# Patient Record
Sex: Female | Born: 1965 | Race: White | Hispanic: No | Marital: Married | Smoking: Never smoker
Health system: Southern US, Community
[De-identification: ages and names within clinical notes are randomized; demographics above are authoritative.]

## PROBLEM LIST (undated history)

## (undated) DIAGNOSIS — R000 Tachycardia, unspecified: Secondary | ICD-10-CM

## (undated) DIAGNOSIS — I10 Essential (primary) hypertension: Secondary | ICD-10-CM

## (undated) DIAGNOSIS — M797 Fibromyalgia: Secondary | ICD-10-CM

## (undated) DIAGNOSIS — Z780 Asymptomatic menopausal state: Secondary | ICD-10-CM

## (undated) DIAGNOSIS — T783XXA Angioneurotic edema, initial encounter: Secondary | ICD-10-CM

## (undated) DIAGNOSIS — N6002 Solitary cyst of left breast: Secondary | ICD-10-CM

## (undated) DIAGNOSIS — N390 Urinary tract infection, site not specified: Secondary | ICD-10-CM

## (undated) HISTORY — DX: Fibromyalgia: M79.7

## (undated) HISTORY — DX: Urinary tract infection, site not specified: N39.0

## (undated) HISTORY — DX: Essential (primary) hypertension: I10

## (undated) HISTORY — DX: Tachycardia, unspecified: R00.0

## (undated) HISTORY — DX: Solitary cyst of left breast: N60.02

## (undated) HISTORY — DX: Asymptomatic menopausal state: Z78.0

## (undated) HISTORY — DX: Angioneurotic edema, initial encounter: T78.3XXA

---

## 1998-01-21 ENCOUNTER — Ambulatory Visit (HOSPITAL_COMMUNITY): Admission: RE | Admit: 1998-01-21 | Discharge: 1998-01-21 | Payer: Self-pay | Admitting: Obstetrics and Gynecology

## 1998-07-30 ENCOUNTER — Other Ambulatory Visit: Admission: RE | Admit: 1998-07-30 | Discharge: 1998-07-30 | Payer: Self-pay | Admitting: Obstetrics and Gynecology

## 1999-10-04 ENCOUNTER — Inpatient Hospital Stay (HOSPITAL_COMMUNITY): Admission: AD | Admit: 1999-10-04 | Discharge: 1999-10-04 | Payer: Self-pay | Admitting: Obstetrics and Gynecology

## 1999-10-09 ENCOUNTER — Inpatient Hospital Stay (HOSPITAL_COMMUNITY): Admission: AD | Admit: 1999-10-09 | Discharge: 1999-10-11 | Payer: Self-pay | Admitting: Obstetrics and Gynecology

## 1999-11-29 ENCOUNTER — Other Ambulatory Visit: Admission: RE | Admit: 1999-11-29 | Discharge: 1999-11-29 | Payer: Self-pay | Admitting: Obstetrics and Gynecology

## 2000-11-01 ENCOUNTER — Encounter: Payer: Self-pay | Admitting: Obstetrics and Gynecology

## 2000-11-01 ENCOUNTER — Encounter: Admission: RE | Admit: 2000-11-01 | Discharge: 2000-11-01 | Payer: Self-pay | Admitting: Obstetrics and Gynecology

## 2000-12-06 ENCOUNTER — Other Ambulatory Visit: Admission: RE | Admit: 2000-12-06 | Discharge: 2000-12-06 | Payer: Self-pay | Admitting: Obstetrics and Gynecology

## 2001-11-27 ENCOUNTER — Other Ambulatory Visit: Admission: RE | Admit: 2001-11-27 | Discharge: 2001-11-27 | Payer: Self-pay | Admitting: Obstetrics and Gynecology

## 2003-01-06 ENCOUNTER — Other Ambulatory Visit: Admission: RE | Admit: 2003-01-06 | Discharge: 2003-01-06 | Payer: Self-pay | Admitting: Obstetrics and Gynecology

## 2004-01-12 ENCOUNTER — Other Ambulatory Visit: Admission: RE | Admit: 2004-01-12 | Discharge: 2004-01-12 | Payer: Self-pay | Admitting: Obstetrics and Gynecology

## 2005-02-06 ENCOUNTER — Other Ambulatory Visit: Admission: RE | Admit: 2005-02-06 | Discharge: 2005-02-06 | Payer: Self-pay | Admitting: Obstetrics and Gynecology

## 2010-03-04 ENCOUNTER — Encounter
Admission: RE | Admit: 2010-03-04 | Discharge: 2010-03-04 | Payer: Self-pay | Source: Home / Self Care | Attending: Obstetrics and Gynecology | Admitting: Obstetrics and Gynecology

## 2012-10-03 ENCOUNTER — Other Ambulatory Visit: Payer: Self-pay | Admitting: Obstetrics and Gynecology

## 2012-10-03 DIAGNOSIS — N6320 Unspecified lump in the left breast, unspecified quadrant: Secondary | ICD-10-CM

## 2012-10-04 ENCOUNTER — Other Ambulatory Visit: Payer: Self-pay | Admitting: *Deleted

## 2012-10-04 DIAGNOSIS — I8390 Asymptomatic varicose veins of unspecified lower extremity: Secondary | ICD-10-CM

## 2012-10-15 ENCOUNTER — Other Ambulatory Visit: Payer: Self-pay

## 2012-10-28 ENCOUNTER — Ambulatory Visit
Admission: RE | Admit: 2012-10-28 | Discharge: 2012-10-28 | Disposition: A | Discharge status: Home / Self Care | Payer: 59 | Source: Ambulatory Visit | Attending: Obstetrics and Gynecology | Admitting: Obstetrics and Gynecology

## 2012-10-28 DIAGNOSIS — N6320 Unspecified lump in the left breast, unspecified quadrant: Secondary | ICD-10-CM

## 2012-12-17 ENCOUNTER — Encounter: Payer: Self-pay | Admitting: Vascular Surgery

## 2012-12-19 ENCOUNTER — Encounter (HOSPITAL_COMMUNITY): Payer: Self-pay

## 2012-12-19 ENCOUNTER — Encounter: Payer: Self-pay | Admitting: Vascular Surgery

## 2013-01-14 ENCOUNTER — Encounter: Payer: Self-pay | Admitting: Vascular Surgery

## 2013-01-15 ENCOUNTER — Other Ambulatory Visit: Payer: Self-pay | Admitting: Vascular Surgery

## 2013-01-15 ENCOUNTER — Ambulatory Visit (INDEPENDENT_AMBULATORY_CARE_PROVIDER_SITE_OTHER): Payer: 59 | Admitting: Vascular Surgery

## 2013-01-15 ENCOUNTER — Encounter: Payer: Self-pay | Admitting: Vascular Surgery

## 2013-01-15 ENCOUNTER — Ambulatory Visit (HOSPITAL_COMMUNITY)
Admission: RE | Admit: 2013-01-15 | Discharge: 2013-01-15 | Disposition: A | Discharge status: Home / Self Care | Payer: 59 | Source: Ambulatory Visit | Attending: Vascular Surgery | Admitting: Vascular Surgery

## 2013-01-15 VITALS — BP 121/62 | HR 77 | Ht 69.0 in | Wt 205.8 lb

## 2013-01-15 DIAGNOSIS — I83893 Varicose veins of bilateral lower extremities with other complications: Secondary | ICD-10-CM | POA: Insufficient documentation

## 2013-01-15 DIAGNOSIS — I8390 Asymptomatic varicose veins of unspecified lower extremity: Secondary | ICD-10-CM | POA: Insufficient documentation

## 2013-01-15 NOTE — Progress Notes (Signed)
[facility] [place]  Patient name: Donna Schmidt MRN: [id number] DOB: August 29, 1965 Sex: female  REASON FOR CONSULT: varicose veins.  HPI: Donna Schmidt is a 47 y.o. 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 She's had spider veins especially on the right leg from any years. She experiences some tingling and heaviness in her right leg that is increased with standing. Her symptoms are not affected by sitting. Elevation helps her symptoms somewhat. She is unaware of any previous history of DVT or phlebitis. She does state that her father had varicose veins. She's had no bleeding episodes from her varicose veins.  PMH: he denies any history of diabetes, hypertension, hypercholesterolemia, history of previous myocardial infarction, or history of congestive heart failure.  Family History  Problem Relation Age of Onset  . Varicose Veins Father   . Hypertension Father    SOCIAL HISTORY: History  Substance Use Topics  . Smoking status: Never Smoker   . Smokeless tobacco: Never Used  . Alcohol Use: Yes     Comment: occ   No Known Allergies Current Outpatient Prescriptions  Medication Sig Dispense Refill  . amoxicillin (AMOXIL) 875 MG tablet Take 1 tablet by mouth 2 (two) times daily.      Marland Kitchen lisinopril-hydrochlorothiazide (PRINZIDE,ZESTORETIC) 20-25 MG per tablet Take 1 tablet by mouth daily.       No current facility-administered medications for this visit.   REVIEW OF SYSTEMS: Arly.Keller ] denotes positive finding; [  ] denotes negative finding  CARDIOVASCULAR:  [ ]  chest pain   [ ]  chest pressure   [ ]  palpitations   [ ]  orthopnea   [ ]  dyspnea on exertion   [ ]  claudication   [ ]  rest pain   [ ]  DVT   [ ]  phlebitis PULMONARY:   [ ]  productive cough   [ ]  asthma   [ ]  wheezing NEUROLOGIC:   [ ]  weakness  [ ]  paresthesias  [ ]  aphasia  [ ]  amaurosis  [ ]  dizziness HEMATOLOGIC:   [ ]  bleeding problems   [ ]  clotting  disorders MUSCULOSKELETAL:  [ ]  joint pain   [ ]  joint swelling Arly.Keller ] leg swelling GASTROINTESTINAL: [ ]   blood in stool  [ ]   hematemesis GENITOURINARY:  [ ]   dysuria  [ ]   hematuria PSYCHIATRIC:  [ ]  history of major depression INTEGUMENTARY:  [ ]  rashes  [ ]  ulcers CONSTITUTIONAL:  [ ]  fever   [ ]  chills  PHYSICAL EXAM: Filed Vitals:   01/15/13 1448  BP: 121/62  Pulse: 77  Height: 5\' 9"  (1.753 m)  Weight: 205 lb 12.8 oz (93.35 kg)  SpO2: 100%   Body mass index is 30.38 kg/(m^2). GENERAL: The patient is a well-nourished female, in no acute distress. The vital signs are documented above. CARDIOVASCULAR: There is a regular rate and rhythm. Do not detect carotid bruits. She has palpable pedal pulses. She has mild bilateral lower extremity swelling. PULMONARY: There is good air exchange bilaterally without wheezing or rales. ABDOMEN: Soft and non-tender with normal pitched bowel sounds.  MUSCULOSKELETAL: There are no major deformities or cyanosis. NEUROLOGIC: No focal weakness or paresthesias are detected. SKIN: She has spider veins and telangiectasias of her right anterior leg and right lateral leg. She has some small spider veins on the left leg also. PSYCHIATRIC: The patient has a normal affect.  DATA:  I have reviewed her records from Dr. Lorrene Reid office. He has normal  thyroid function tests. Her creatinine is normal. Cholesterol is 168. HDL cholesterol is 44.  I have independently interpreted the carotid duplex scan done in our office today which shows no evidence of DVT in either lower extremity. There is no evidence of superficial thrombophlebitis bilaterally. She does have some venous incompetence of the deep veins bilaterally and also segments of her greater saphenous vein bilaterally.  MEDICAL ISSUES:  Varicose veins of lower extremities with other complications This patient has spider veins on the right which are causing minimal symptoms. I think she would be a slightly high  risk for recurrence with sclerotherapy given the reflux she has in the deep system and also segments of her saphenous vein on the right. However at this point I do not think the reflux in the saphenous vein on the right wards consideration for laser ablation. Essentially has no symptoms on the left leg with some very small varicose veins. We have discussed the importance of intermittent leg elevation and the proper positioning for this. In addition I have written her a prescription for knee-high compression stockings with a gradient of 15-20 mm of mercury. Encouraged her to stay as active as possible and avoid prolonged sitting and standing. We will be happy to see her back at any time if her symptoms progress or her varicose veins progress.   Wendelyn Kiesling S [facility] [place] Beeper: [phone]

## 2013-01-15 NOTE — Assessment & Plan Note (Signed)
This patient has spider veins on the right which are causing minimal symptoms. I think she would be a slightly high risk for recurrence with sclerotherapy given the reflux she has in the deep system and also segments of her saphenous vein on the right. However at this point I do not think the reflux in the saphenous vein on the right wards consideration for laser ablation. Essentially has no symptoms on the left leg with some very small varicose veins. We have discussed the importance of intermittent leg elevation and the proper positioning for this. In addition I have written her a prescription for knee-high compression stockings with a gradient of 15-20 mm of mercury. Encouraged her to stay as active as possible and avoid prolonged sitting and standing. We will be happy to see her back at any time if her symptoms progress or her varicose veins progress.

## 2013-01-16 ENCOUNTER — Encounter: Payer: Self-pay | Admitting: Obstetrics and Gynecology

## 2013-11-10 ENCOUNTER — Other Ambulatory Visit: Payer: Self-pay | Admitting: Obstetrics and Gynecology

## 2013-11-10 DIAGNOSIS — N6002 Solitary cyst of left breast: Secondary | ICD-10-CM

## 2013-11-18 ENCOUNTER — Ambulatory Visit
Admission: RE | Admit: 2013-11-18 | Discharge: 2013-11-18 | Disposition: A | Discharge status: Home / Self Care | Payer: 59 | Source: Ambulatory Visit | Attending: Obstetrics and Gynecology | Admitting: Obstetrics and Gynecology

## 2013-11-18 ENCOUNTER — Encounter (INDEPENDENT_AMBULATORY_CARE_PROVIDER_SITE_OTHER): Payer: Self-pay

## 2013-11-18 DIAGNOSIS — N6002 Solitary cyst of left breast: Secondary | ICD-10-CM

## 2015-02-02 ENCOUNTER — Other Ambulatory Visit: Payer: Self-pay | Admitting: Obstetrics and Gynecology

## 2015-02-02 DIAGNOSIS — Z1231 Encounter for screening mammogram for malignant neoplasm of breast: Secondary | ICD-10-CM

## 2015-03-09 ENCOUNTER — Ambulatory Visit
Admission: RE | Admit: 2015-03-09 | Discharge: 2015-03-09 | Disposition: A | Discharge status: Home / Self Care | Payer: BC Managed Care – PPO | Source: Ambulatory Visit | Attending: Obstetrics and Gynecology | Admitting: Obstetrics and Gynecology

## 2015-03-09 DIAGNOSIS — Z1231 Encounter for screening mammogram for malignant neoplasm of breast: Secondary | ICD-10-CM

## 2016-03-21 ENCOUNTER — Other Ambulatory Visit: Payer: Self-pay | Admitting: Obstetrics and Gynecology

## 2016-03-21 DIAGNOSIS — Z1231 Encounter for screening mammogram for malignant neoplasm of breast: Secondary | ICD-10-CM

## 2016-04-17 ENCOUNTER — Ambulatory Visit
Admission: RE | Admit: 2016-04-17 | Discharge: 2016-04-17 | Disposition: A | Discharge status: Home / Self Care | Payer: BC Managed Care – PPO | Source: Ambulatory Visit | Attending: Obstetrics and Gynecology | Admitting: Obstetrics and Gynecology

## 2016-04-17 DIAGNOSIS — Z1231 Encounter for screening mammogram for malignant neoplasm of breast: Secondary | ICD-10-CM

## 2016-04-19 ENCOUNTER — Other Ambulatory Visit: Payer: Self-pay | Admitting: Obstetrics and Gynecology

## 2016-04-19 DIAGNOSIS — R928 Other abnormal and inconclusive findings on diagnostic imaging of breast: Secondary | ICD-10-CM

## 2016-04-26 ENCOUNTER — Ambulatory Visit
Admission: RE | Admit: 2016-04-26 | Discharge: 2016-04-26 | Disposition: A | Discharge status: Home / Self Care | Payer: BC Managed Care – PPO | Source: Ambulatory Visit | Attending: Obstetrics and Gynecology | Admitting: Obstetrics and Gynecology

## 2016-04-26 DIAGNOSIS — R928 Other abnormal and inconclusive findings on diagnostic imaging of breast: Secondary | ICD-10-CM

## 2017-02-23 ENCOUNTER — Other Ambulatory Visit: Payer: Self-pay | Admitting: Obstetrics and Gynecology

## 2017-02-23 DIAGNOSIS — N6489 Other specified disorders of breast: Secondary | ICD-10-CM

## 2017-02-27 ENCOUNTER — Other Ambulatory Visit: Payer: BC Managed Care – PPO

## 2017-02-28 ENCOUNTER — Ambulatory Visit
Admission: RE | Admit: 2017-02-28 | Discharge: 2017-02-28 | Disposition: A | Discharge status: Home / Self Care | Payer: BC Managed Care – PPO | Source: Ambulatory Visit | Attending: Obstetrics and Gynecology | Admitting: Obstetrics and Gynecology

## 2017-02-28 DIAGNOSIS — N6489 Other specified disorders of breast: Secondary | ICD-10-CM

## 2018-04-22 ENCOUNTER — Other Ambulatory Visit: Payer: Self-pay | Admitting: Obstetrics and Gynecology

## 2018-04-22 ENCOUNTER — Other Ambulatory Visit: Payer: Self-pay | Admitting: Neurosurgery

## 2018-04-22 DIAGNOSIS — Z1231 Encounter for screening mammogram for malignant neoplasm of breast: Secondary | ICD-10-CM

## 2018-05-20 ENCOUNTER — Ambulatory Visit
Admission: RE | Admit: 2018-05-20 | Discharge: 2018-05-20 | Disposition: A | Discharge status: Home / Self Care | Payer: BC Managed Care – PPO | Source: Ambulatory Visit | Attending: Obstetrics and Gynecology | Admitting: Obstetrics and Gynecology

## 2018-05-20 DIAGNOSIS — Z1231 Encounter for screening mammogram for malignant neoplasm of breast: Secondary | ICD-10-CM

## 2018-05-22 ENCOUNTER — Other Ambulatory Visit: Payer: Self-pay | Admitting: Obstetrics and Gynecology

## 2018-05-22 DIAGNOSIS — R928 Other abnormal and inconclusive findings on diagnostic imaging of breast: Secondary | ICD-10-CM

## 2018-05-28 ENCOUNTER — Ambulatory Visit
Admission: RE | Admit: 2018-05-28 | Discharge: 2018-05-28 | Disposition: A | Discharge status: Home / Self Care | Payer: BC Managed Care – PPO | Source: Ambulatory Visit | Attending: Obstetrics and Gynecology | Admitting: Obstetrics and Gynecology

## 2018-05-28 DIAGNOSIS — R928 Other abnormal and inconclusive findings on diagnostic imaging of breast: Secondary | ICD-10-CM

## 2019-06-16 ENCOUNTER — Other Ambulatory Visit: Payer: Self-pay | Admitting: Obstetrics and Gynecology

## 2019-06-16 DIAGNOSIS — Z1231 Encounter for screening mammogram for malignant neoplasm of breast: Secondary | ICD-10-CM

## 2019-07-01 ENCOUNTER — Ambulatory Visit
Admission: RE | Admit: 2019-07-01 | Discharge: 2019-07-01 | Disposition: A | Discharge status: Home / Self Care | Payer: BC Managed Care – PPO | Source: Ambulatory Visit | Attending: Obstetrics and Gynecology | Admitting: Obstetrics and Gynecology

## 2019-07-01 ENCOUNTER — Other Ambulatory Visit: Payer: Self-pay

## 2019-07-01 DIAGNOSIS — Z1231 Encounter for screening mammogram for malignant neoplasm of breast: Secondary | ICD-10-CM

## 2019-12-04 IMAGING — MG DIGITAL DIAGNOSTIC UNILATERAL RIGHT MAMMOGRAM WITH TOMO AND CAD
4 series · 4 of 12 positions shown · non-contrast
Comparison: Previous exam(s).

CLINICAL DATA: Recall from screening mammography with
tomosynthesis, mass or asymmetry involving the OUTER RIGHT breast at
MIDDLE depth visible only on the CC images.

EXAM:
DIGITAL DIAGNOSTIC RIGHT MAMMOGRAM WITH CAD AND TOMO
ULTRASOUND RIGHT BREAST

[R CC synth-2D]
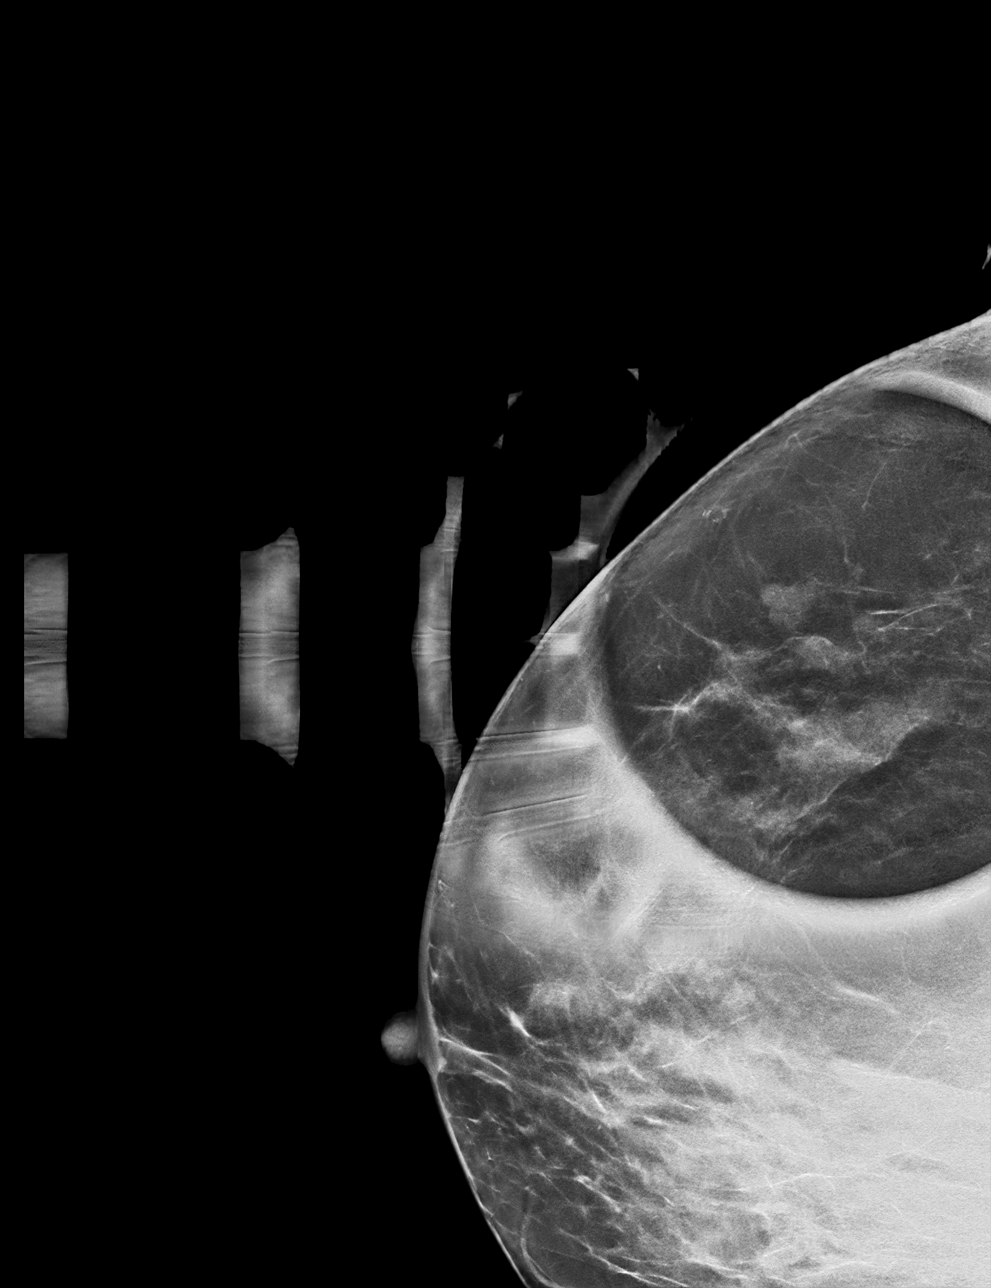

[R ML synth-2D]
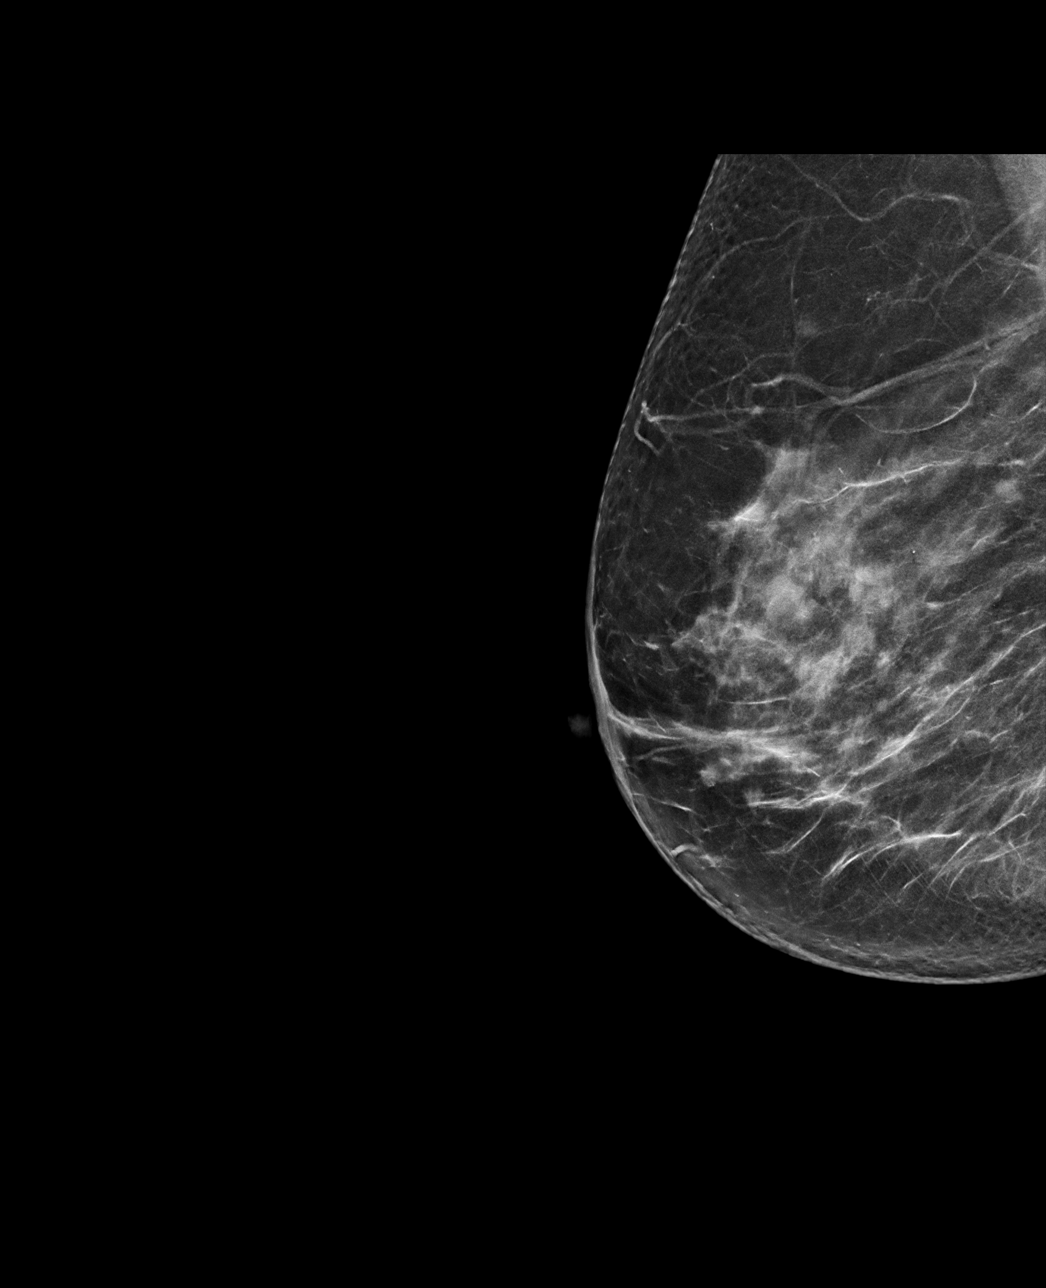

[R CC tomo · tomo slice 33/64.0]
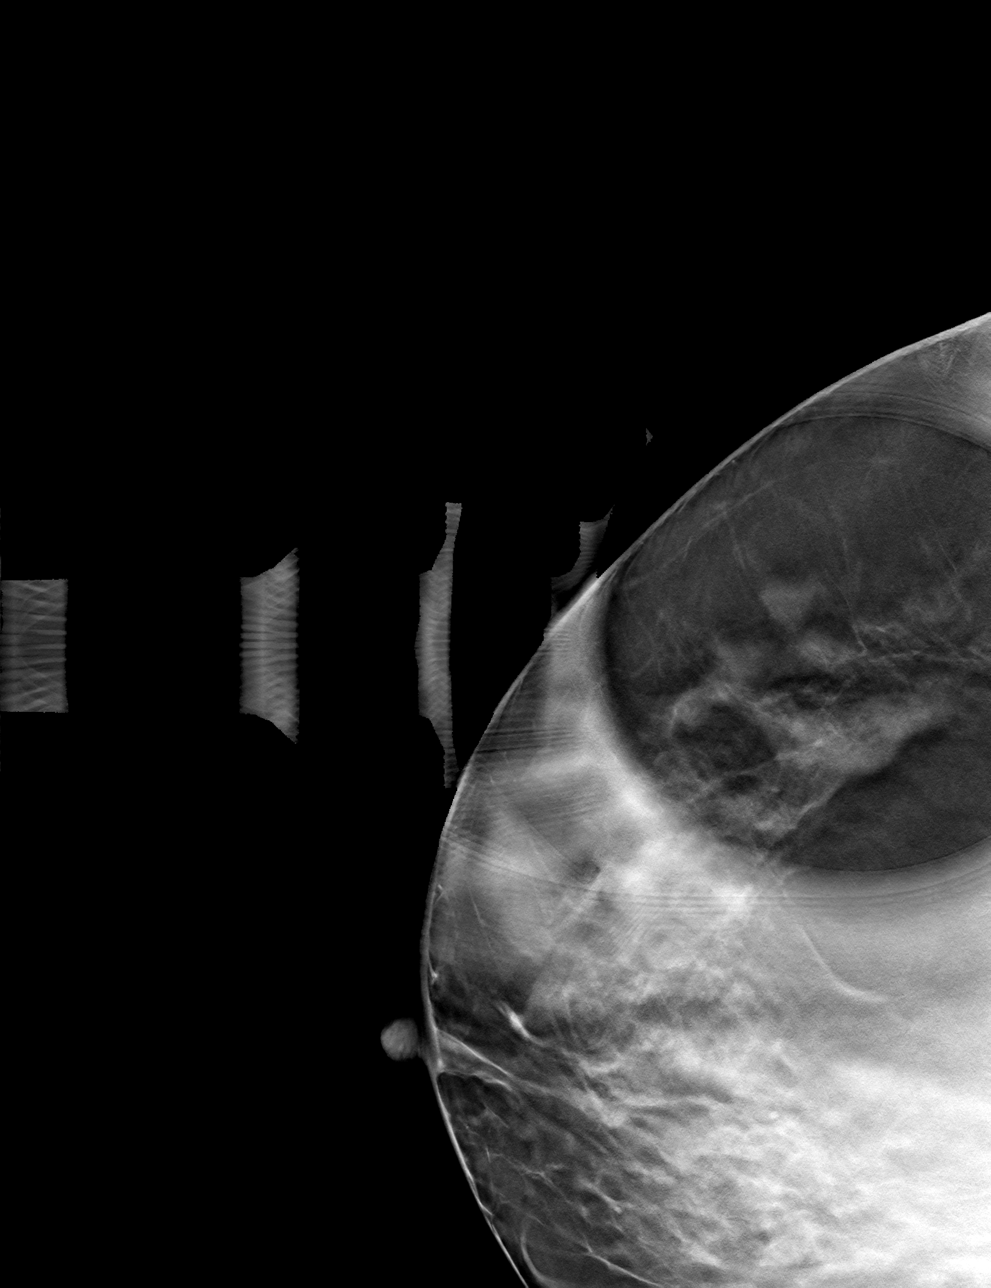

[R ML tomo · tomo slice 41/82.0]
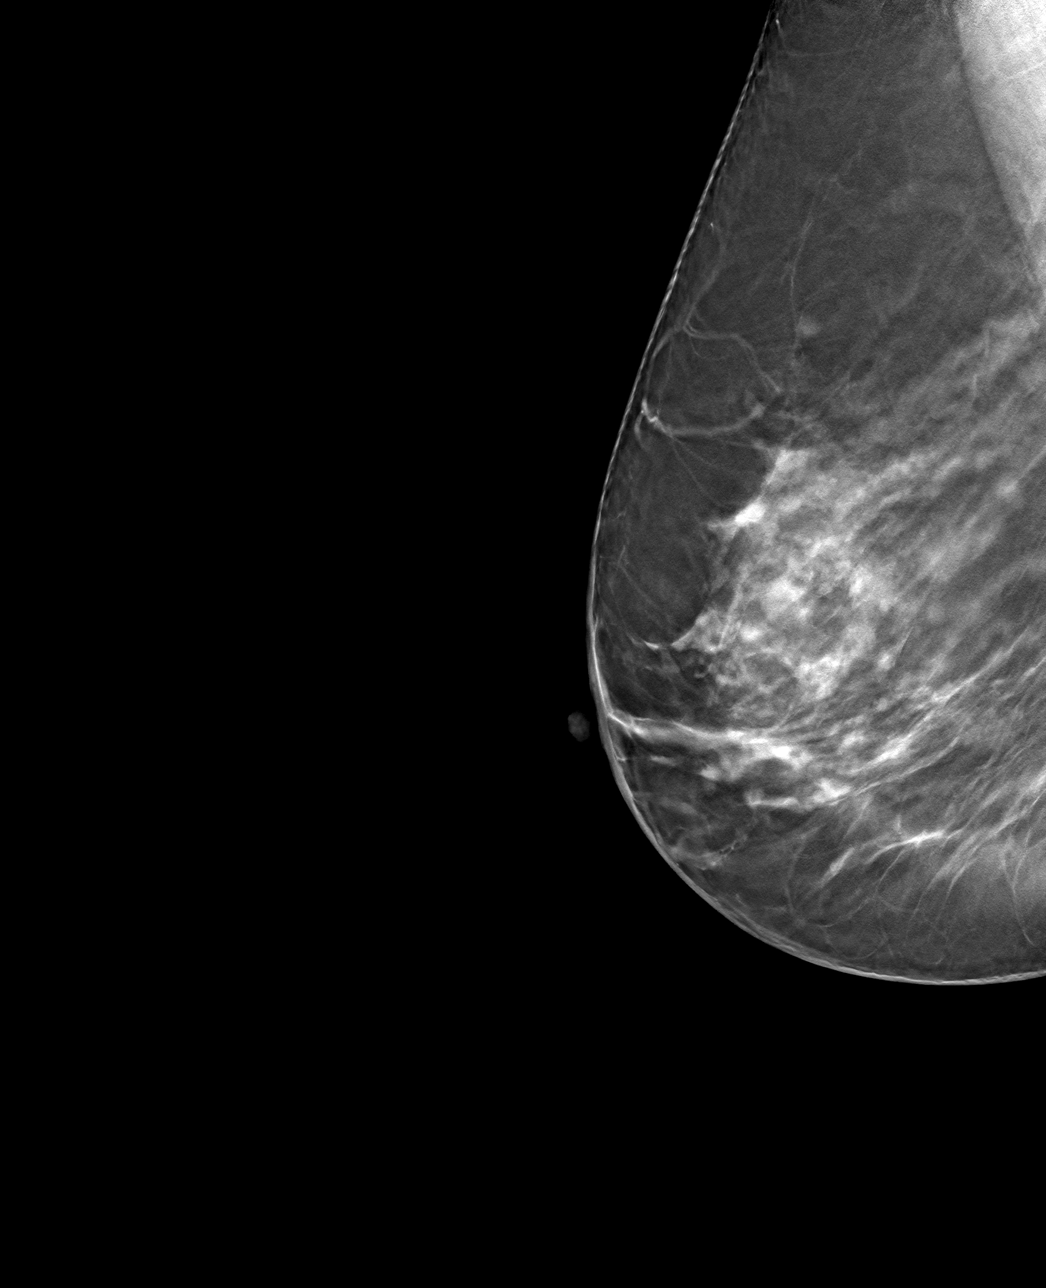

[4 of 12 positions shown; findings below may reference images not displayed]

ACR Breast Density Category c: The breast tissue is heterogeneously
dense, which may obscure small masses.
FINDINGS: Tomosynthesis and synthesized spot compression CC view of the area
of concern in the RIGHT breast in a standard and tomosynthesis full
field mediolateral view of the RIGHT breast were obtained.

Spot compression images a macrolobulated low-density mass in the
OUTER breast at MIDDLE depth, measuring approximately 1 cm, without
associated architectural distortion or suspicious calcifications.
The mass localizes to the approximate retroareolar location on the
full field mediolateral images indicating its location at or near 9
o'clock. Circumscribed low-density masses without associated
architectural distortion or suspicious calcifications are present
elsewhere in the RIGHT breast on the full field mediolateral images.

The full field mediolateral image was processed with CAD.

Targeted RIGHT breast ultrasound is performed, showing an oval
circumscribed anechoic mass with thin internal septations and
macrolobular margins at the 9:30 o'clock position approximately 3 cm
from the nipple measuring approximately 0.5 x 1.2 x 1.0 cm,
demonstrating posterior acoustic enhancement and no internal power
Doppler flow, corresponding to the screening mammographic finding.
Similar smaller anechoic masses are present at the 9:30 o'clock
position approximately 2 cm from the nipple and at the 9:30 o'clock
position approximately 4 cm from the nipple. No suspicious solid
mass or abnormal acoustic shadowing is identified.
IMPRESSION: 1. No mammographic or sonographic evidence of malignancy involving
the RIGHT breast.
2. Benign cysts in the OUTER RIGHT breast, the largest of which
accounts for the screening mammographic finding.

RECOMMENDATION:
Screening mammogram in one year.(Code:LX-U-ITQ)

I have discussed the findings and recommendations with the patient.
Results were also provided in writing at the conclusion of the
visit. If applicable, a reminder letter will be sent to the patient
regarding the next appointment.

BI-RADS CATEGORY  2: Benign.

## 2019-12-04 IMAGING — US ULTRASOUND RIGHT BREAST LIMITED
1 series · 8 of 8 positions shown · non-contrast
Comparison: Previous exam(s).

CLINICAL DATA: Recall from screening mammography with
tomosynthesis, mass or asymmetry involving the OUTER RIGHT breast at
MIDDLE depth visible only on the CC images.

EXAM:
DIGITAL DIAGNOSTIC RIGHT MAMMOGRAM WITH CAD AND TOMO
ULTRASOUND RIGHT BREAST

[Series 1: ultrasound right breast limited · 0.07mm/px · 8 of 8 slices shown]
[im 1/8]
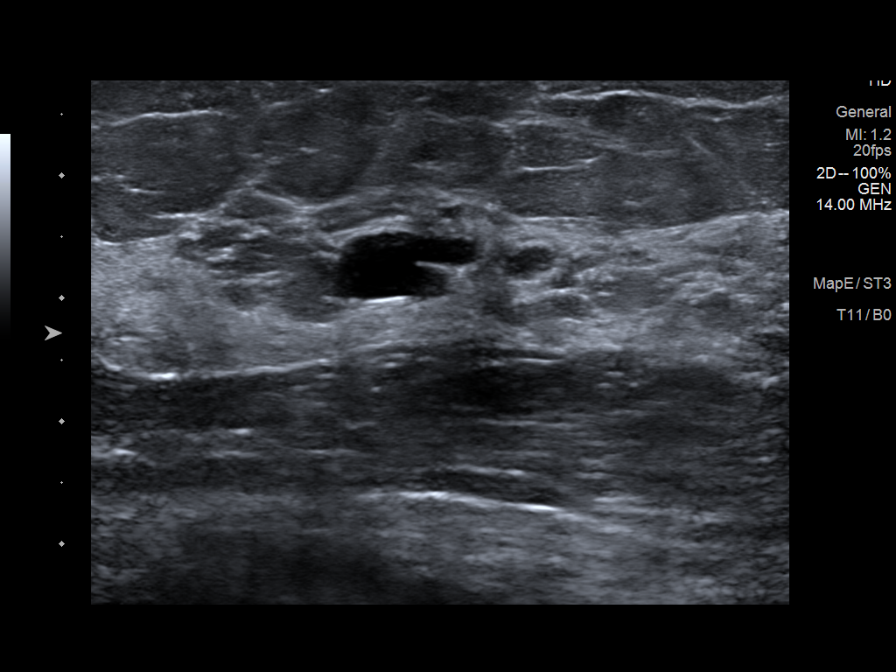
[im 2/8]
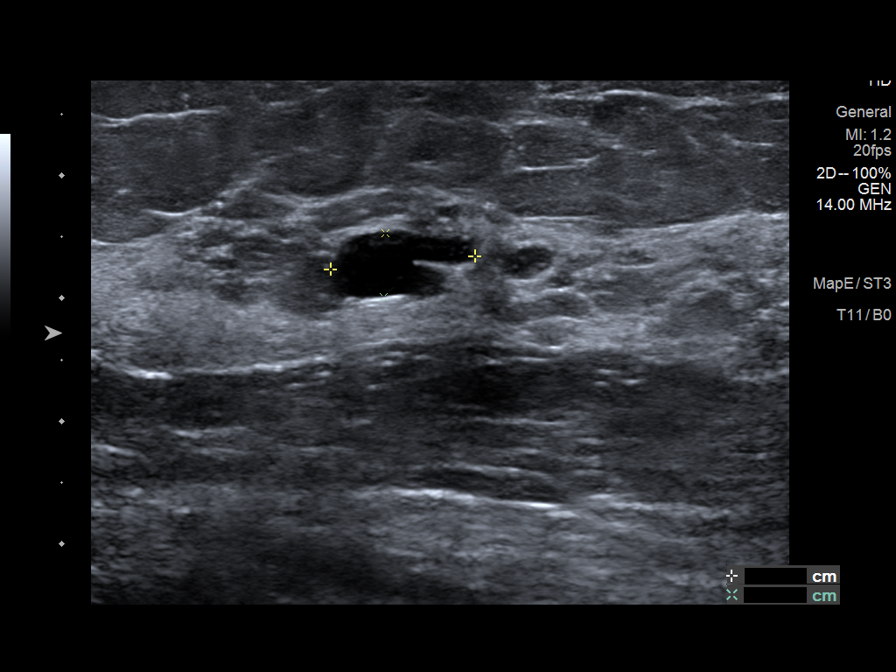
[im 3/8]
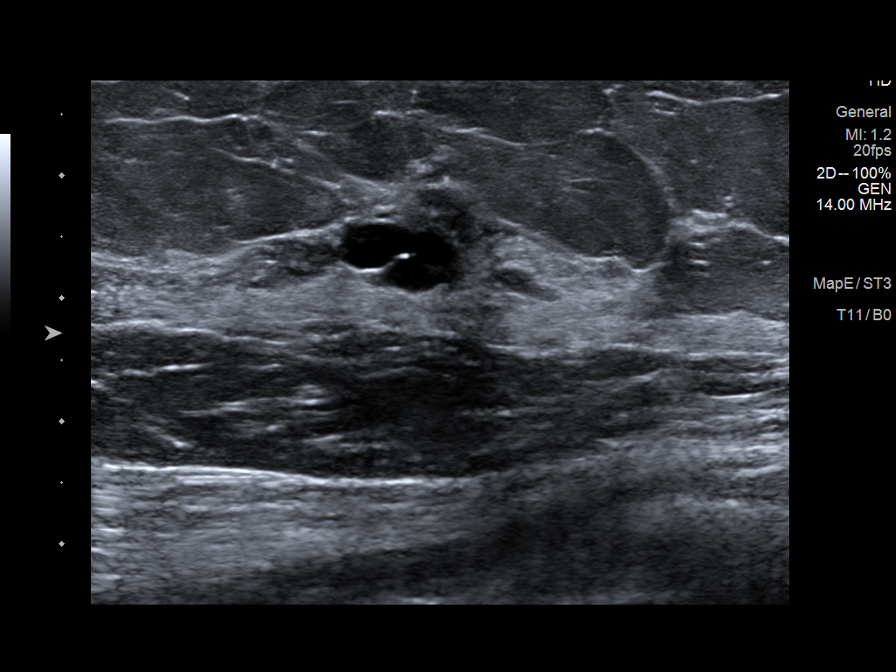
[im 4/8]
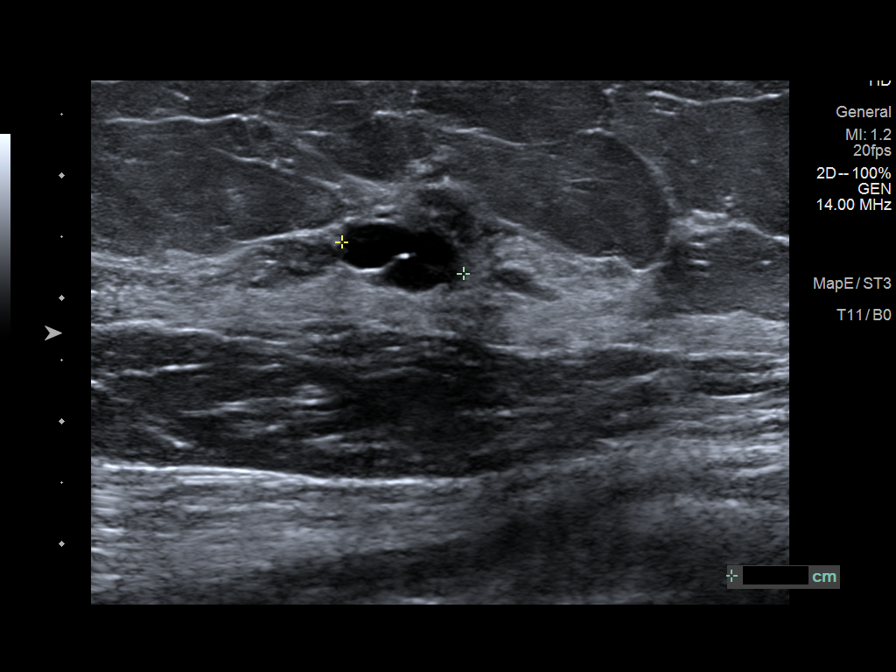
[im 5/8]
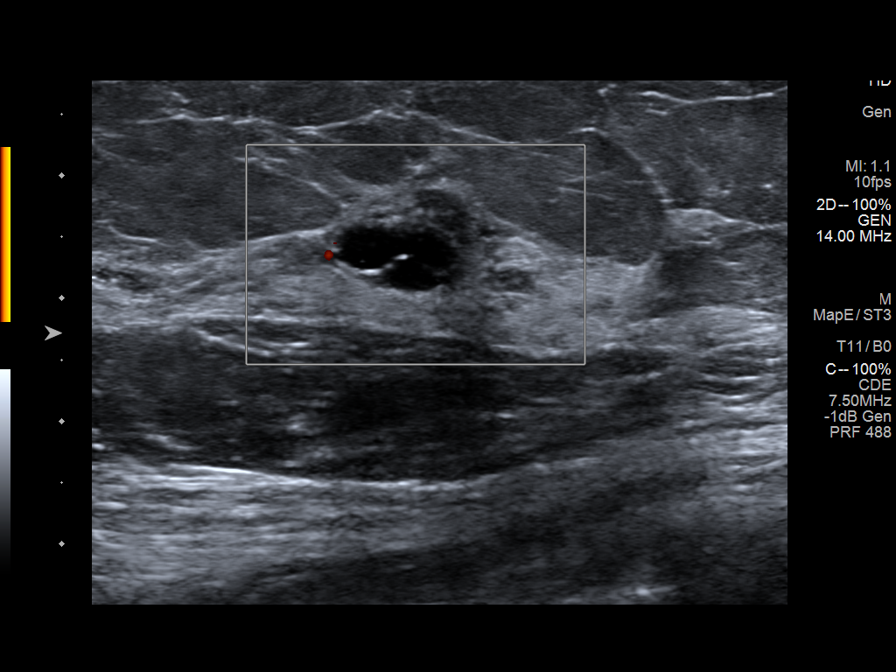
[im 6/8]
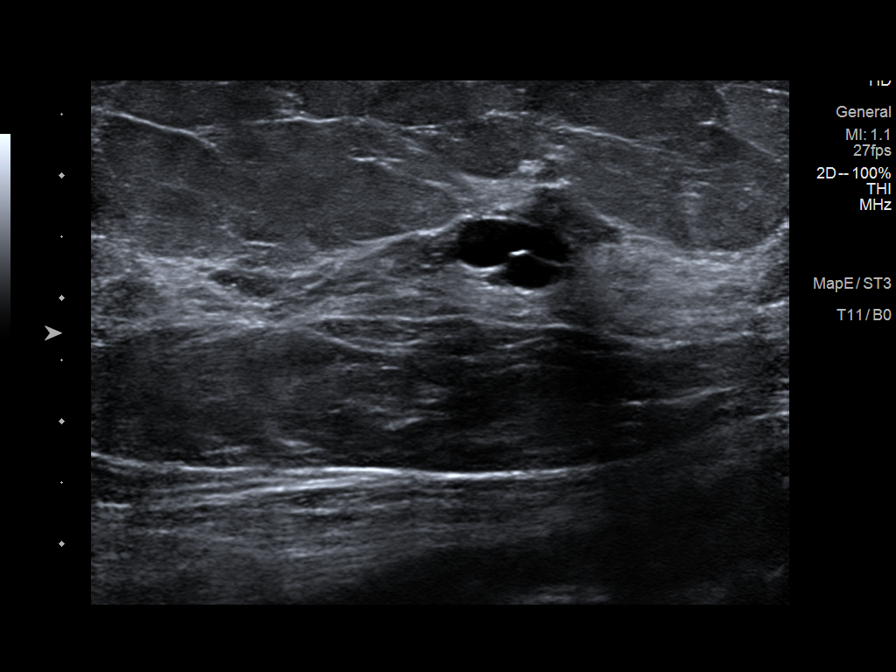
[im 7/8]
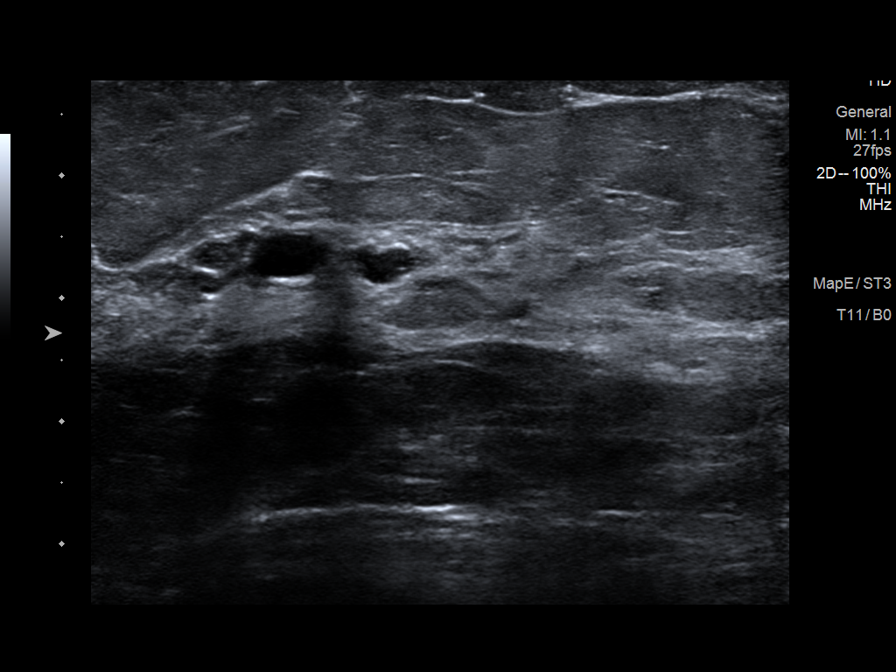
[im 8/8]
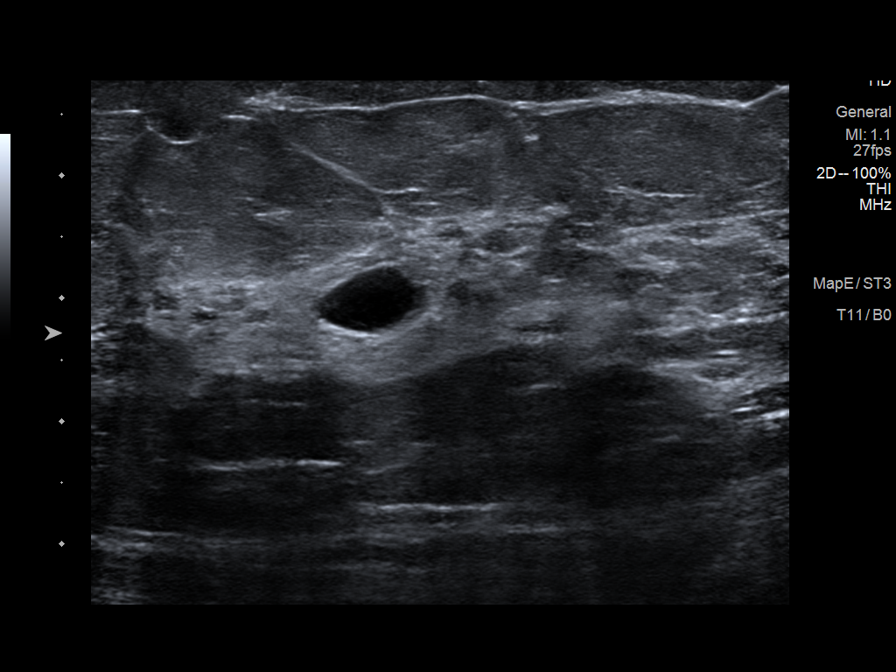

[8 of 8 positions shown; findings below may reference images not displayed]

ACR Breast Density Category c: The breast tissue is heterogeneously
dense, which may obscure small masses.
FINDINGS: Tomosynthesis and synthesized spot compression CC view of the area
of concern in the RIGHT breast in a standard and tomosynthesis full
field mediolateral view of the RIGHT breast were obtained.

Spot compression images a macrolobulated low-density mass in the
OUTER breast at MIDDLE depth, measuring approximately 1 cm, without
associated architectural distortion or suspicious calcifications.
The mass localizes to the approximate retroareolar location on the
full field mediolateral images indicating its location at or near 9
o'clock. Circumscribed low-density masses without associated
architectural distortion or suspicious calcifications are present
elsewhere in the RIGHT breast on the full field mediolateral images.

The full field mediolateral image was processed with CAD.

Targeted RIGHT breast ultrasound is performed, showing an oval
circumscribed anechoic mass with thin internal septations and
macrolobular margins at the 9:30 o'clock position approximately 3 cm
from the nipple measuring approximately 0.5 x 1.2 x 1.0 cm,
demonstrating posterior acoustic enhancement and no internal power
Doppler flow, corresponding to the screening mammographic finding.
Similar smaller anechoic masses are present at the 9:30 o'clock
position approximately 2 cm from the nipple and at the 9:30 o'clock
position approximately 4 cm from the nipple. No suspicious solid
mass or abnormal acoustic shadowing is identified.
IMPRESSION: 1. No mammographic or sonographic evidence of malignancy involving
the RIGHT breast.
2. Benign cysts in the OUTER RIGHT breast, the largest of which
accounts for the screening mammographic finding.

RECOMMENDATION:
Screening mammogram in one year.(Code:LX-U-ITQ)

I have discussed the findings and recommendations with the patient.
Results were also provided in writing at the conclusion of the
visit. If applicable, a reminder letter will be sent to the patient
regarding the next appointment.

BI-RADS CATEGORY  2: Benign.

## 2020-07-13 ENCOUNTER — Encounter: Payer: Self-pay | Admitting: Orthopaedic Surgery

## 2020-07-13 ENCOUNTER — Other Ambulatory Visit: Payer: Self-pay

## 2020-07-13 ENCOUNTER — Ambulatory Visit (INDEPENDENT_AMBULATORY_CARE_PROVIDER_SITE_OTHER): Payer: BC Managed Care – PPO | Admitting: Orthopaedic Surgery

## 2020-07-13 DIAGNOSIS — M25512 Pain in left shoulder: Secondary | ICD-10-CM

## 2020-07-13 DIAGNOSIS — M25511 Pain in right shoulder: Secondary | ICD-10-CM | POA: Diagnosis not present

## 2020-07-13 NOTE — Progress Notes (Signed)
Office Visit Note   Patient: Donna Schmidt           Date of Birth: February 05, 1966           MRN: [id number] Visit Date: 07/13/2020              Requested by: Donna Inch, MD [address] [place],  [place] [address] PCP: Donna Inch, MD   Assessment & Plan: Visit Diagnoses:  1. Acute pain of both shoulders     Plan: [place] has developed bilateral shoulder pain particularly with overhead activity after working out at [employer]  She thinks it may be related to increasing her reps and her weights.  By exam I suspect she either has a subacromial bursitis or rotator cuff tendinitis.  She is actually feeling much better having not done any overhead workouts for the past several weeks.  Read then proceed with any injections or further diagnostic testing I think it is worth waiting to see what happens with limited overhead activity for several weeks.  If no improvement she will return  Follow-Up Instructions: Return if symptoms worsen or fail to improve.   Orders:  No orders of the defined types were placed in this encounter.  No orders of the defined types were placed in this encounter.     Procedures: No procedures performed   Clinical Data: No additional findings.   Subjective: Chief Complaint  Patient presents with  . Right Upper Arm - Pain  . Left Upper Arm - Pain  Patient presents today for bilateral arm pain. She said that it started 24-48hours after working out. No injury. He pain is located in the proximal arms. She said that she has no numbness, tingling, or weakness in her arms. She does have decreased range of motion. She said that she initially was taking over the counter medicine. She has noticed a lot of improvement and does not need to take anything anymore. She wants to work out again, but wants to make sure that it is okay to do.   HPI  Review of Systems   Objective: Vital Signs: LMP 08/19/2012   Physical Exam Constitutional:       Appearance: She is well-developed.  Eyes:     Pupils: Pupils are equal, round, and reactive to light.  Pulmonary:     Effort: Pulmonary effort is normal.  Skin:    General: Skin is warm and dry.  Neurological:     Mental Status: She is alert and oriented to person, place, and time.  Psychiatric:        Behavior: Behavior normal.     Ortho Exam negative impingement empty can testing.  Minimally positive Speed sign bilaterally.  Skin intact neurologically intact.  She could fully raise her arm over her head but with a circuitous arc of motion bilaterally.  Minimally positive impingement and negative empty can testing.  No crepitation.  Good strength  Specialty Comments:  No specialty comments available.  Imaging: No results found.   PMFS History: Patient Active Problem List   Diagnosis Date Noted  . Bilateral shoulder pain 07/13/2020  . Varicose veins of lower extremities with other complications 01/15/2013   History reviewed. No pertinent past medical history.  Family History  Problem Relation Age of Onset  . Varicose Veins Father   . Hypertension Father     History reviewed. No pertinent surgical history. Social History   Occupational History  . Not on file  Tobacco Use  .  Smoking status: Never Smoker  . Smokeless tobacco: Never Used  Substance and Sexual Activity  . Alcohol use: Yes    Comment: occ  . Drug use: No  . Sexual activity: Not on file

## 2020-12-22 ENCOUNTER — Other Ambulatory Visit: Payer: Self-pay | Admitting: Obstetrics and Gynecology

## 2020-12-22 DIAGNOSIS — Z1231 Encounter for screening mammogram for malignant neoplasm of breast: Secondary | ICD-10-CM

## 2021-01-18 NOTE — Progress Notes (Signed)
NEW PATIENT Date of Service/Encounter:  01/19/21 Referring provider: Nicholes Rough, PA-C Primary care provider: Chesley Noon, MD  Subjective:  Donna Schmidt is a 55 y.o. female with a PMHx of hypertension, chronic rhinitis presenting today for evaluation of concern for food allergies. History obtained from: chart review and patient.   Foods of concern:  Lemons-tongue would get little blisters, stomach swells, and her left side would hurt.  Symptoms would start immediately, but would last a few hours. Stopped drinking water with lemon,  Wine or grapes she will get an immediate blister on her tongue.  No other systemic symptoms.   Tomatoes-tongue blisters, gum swell and one time bled, will occasoinally get a rash on her jaw line and feels her stomach will swell.  Rash on face can last a few days, gums and tongue symptoms start immediately and last a few hours.   Rotisserie chicken-stomach swells and it happens immediately.  No other systemic symptoms.  Thinks this maybe secondary to spice. Strawberries, pineapple and apples sometimes cause mouth symptoms. Milk was positive on blood testing, but she eats dairy products regularly without any symptoms.  Stomach symptoms have worsened over past year and happen randomly with different foods. She has seen GI in the past, but not for this specific problems.  Colonoscopy ~ 1 year ago, reportedly normal.  Reviewed serum testing - food profile negative, (milk 0.16-this is negative), vegetable profile negative, citrus profile (including lime and lemon)-negative  Chronic rhinitis:  She does have seasonal allergies during Fall-ear fullness, ear pain.  Stuffy nose as well.  Will use flonase 1-2 SEN PRN (usually for weeks at a time).  Other allergy screening: Asthma: no-one period as a teenager was told she might have it, but never carried an inhaler and has not had similar symptoms since Medication allergy:  erythromycin used to make  her sick, but hasn't had in 25 years Hymenoptera allergy: no Urticaria: no Eczema:no History of recurrent infections suggestive of immunodeficency: no Vaccinations are up to date except COVID - has only had 1 of series.  Past Medical History: Past Medical History:  Diagnosis Date   Angio-edema    Medication List:  Current Outpatient Medications  Medication Sig Dispense Refill   EPINEPHrine 0.3 mg/0.3 mL IJ SOAJ injection Inject into the muscle.     Fluticasone Propionate (XHANCE) 93 MCG/ACT EXHU Place 2 sprays into the nose daily. 16 mL 6   amoxicillin (AMOXIL) 875 MG tablet Take 1 tablet by mouth 2 (two) times daily.     fluticasone (FLONASE) 50 MCG/ACT nasal spray Flonase     lisinopril-hydrochlorothiazide (PRINZIDE,ZESTORETIC) 20-25 MG per tablet Take 1 tablet by mouth daily.     No current facility-administered medications for this visit.   Known Allergies:  Allergies  Allergen Reactions   Erythromycin Base    Past Surgical History: History reviewed. No pertinent surgical history. Family History: Family History  Problem Relation Age of Onset   Varicose Veins Father    Hypertension Father    Social History: Donna Schmidt lives in a home built 70 years ago, remodeled, + carpet in bedroom, oil heating, central AC, no pests, dust mite covers on bed, none on pillows, works as Environmental consultant x 5 years, no HEPA filter in home, not near industrial/interstate.   ROS:  All other systems negative except as noted per HPI.  Objective:  Blood pressure 122/84, pulse 85, temperature 97.7 F (36.5 C), resp. rate 18, height 5\' 9"  (1.753 m), weight 239 lb  12.8 oz (108.8 kg), last menstrual period 08/19/2012, SpO2 96 %. Body mass index is 35.41 kg/m. Physical Exam:  General Appearance:  Alert, cooperative, no distress, appears stated age  Head:  Normocephalic, without obvious abnormality, atraumatic  Eyes:  Conjunctiva clear, EOM's intact  Nose: Nares normal, mildly edematous turbinates,  pink moucosa  Ears Normal EACs, normal TMs  Throat: Lips, tongue normal; teeth and gums normal, normal posterior oropharynx  Neck: Supple, symmetrical  Lungs:   CTAB, Respirations unlabored, no coughing  Heart:  RRR, no murmur, Appears well perfused  Extremities: No edema  Skin: Skin color, texture, turgor normal, no rashes or lesions on visualized portions of skin  Neurologic: No gross deficits   Diagnostics: Skin Testing: Environmental allergy panel and select foods. Adequate positive and negative controls. Results discussed with patient/family.  Airborne Adult Perc - 01/19/21 1418     Time Antigen Placed 1418    Allergen Manufacturer Lavella Hammock    Location Back    Number of Test 59    Panel 1 Select    1. Control-Buffer 50% Glycerol Negative    2. Control-Histamine 1 mg/ml 3+    3. Albumin saline Negative    4. Florien Negative    5. [place] Negative    6. Johnson Negative    7. [place] Blue Negative    8. Meadow Fescue Negative    9. Perennial Rye Negative    10. Sweet Vernal Negative    11. Timothy Negative    12. Cocklebur 3+    13. Burweed Marshelder 3+    14. Ragweed, short 3+    15. Ragweed, Giant 3+    16. Plantain,  English Negative    17. Lamb's Quarters Negative    18. Sheep Sorrell Negative    19. Rough Pigweed Negative    20. Marsh Elder, Rough Negative    21. Mugwort, Common 3+    22. Ash mix Negative    23. Birch mix Negative    24. Beech American Negative    25. Box, Elder Negative    26. Cedar, red 3+    27. Cottonwood, Eastern 3+    28. Elm mix Negative    29. Hickory Negative    30. Maple mix 3+    31. Oak, [place] mix 3+    32. Pecan Pollen 3+    33. Pine mix 3+    34. Sycamore Eastern 3+    35. [place], Black Pollen 3+    36. Alternaria alternata 3+    37. Cladosporium Herbarum 3+    38. Aspergillus mix Negative    39. Penicillium mix Negative    40. Bipolaris sorokiniana (Helminthosporium) Negative    41. Drechslera spicifera (Curvularia)  Negative    42. Mucor plumbeus Negative    43. Fusarium moniliforme Negative    44. Aureobasidium pullulans (pullulara) Negative    45. Rhizopus oryzae Negative    46. Botrytis cinera Negative    47. Epicoccum nigrum Negative    48. Phoma betae Negative    49. Candida Albicans Negative    50. Trichophyton mentagrophytes Negative    51. Mite, D Farinae  5,000 AU/ml 3+    52. Mite, D Pteronyssinus  5,000 AU/ml 3+    53. Cat Hair 10,000 BAU/ml Negative    54.  Dog Epithelia Negative    55. Mixed Feathers Negative    56. Horse Epithelia 3+    57. Cockroach, German Negative    58. Mouse Negative  59. Tobacco Leaf Negative             Food Adult Perc - 01/19/21 1400     Time Antigen Placed 1419    Allergen Manufacturer Waynette Buttery    Location Back    Number of allergen test 10    Control-Histamine 1 mg/ml 3+    5. Milk, cow Negative    7. Casein Negative    42. Tomato Negative    55. Grape (White seedless) --   5x5   56. Orange  Negative    58. Apple Negative    60. Strawberry Negative    63. Pineapple Negative    71. Pepper, black Negative             Allergy testing results were read and interpreted by myself, documented by clinical staff.  Assessment and Plan   Patient Instructions  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 to citrus fruits/vegetables - testing today to milk, grapes, tomatoes, apples, strawberries, oranges, pineapple and black pepper were all negative (except borderline to orange but since you have eaten and tolerated oranges, suspect false positive) - your previous blood work was also negative including to oranges and milk (cut off for positive is > 0.35) + you are eating dairy regularly without symptoms, ruling this out - symptoms sound concerning for irritant contact dermatitis - avoidance is treatment - doubt systemic reaction based on history, if you did have a more severe reaction or concern for airway swelling, please use epinephrine  autoinjector and call 911 --> should you have a reaction like this, please let us know - consider GI referral for discussion of other possible causes of abdominal bloating including IBD/IBS, etc (reassuring that your recent colonoscopy was normal)  Chronic rhinitis with history of ear effusions-perennial and seasonal allergic rhinitis - allergy testing positive to weeds, ragweed, trees, outdoor molds, dust mites, horse  - trial of Xhance (fluticasone); can use in place of flonase - 1-2 sprays daily or for block periods of time when symptomatic - can add daily antihistamine if needed, Your options include: Zyrtec (cetirizine) 10 mg, Claritin (loratadine) 10 mg, Xyzal (levocetirizine) 5 mg or Allegra (fexofenadine) 180 mg daily as needed -consider Astelin (azelastine) use 1-2 sprays in each nostril up to three times daily as needed for NASAL CONGESTION/ITCHY NOSE. (Now available over the counter) - avoidance measures as below, can consider allergy shots for long term benefits  Follow-up in 4-6 months for allergic rhinitis.  This note in its entirety was forwarded to the Provider who requested this consultation.  Thank you for your kind referral. I appreciate the opportunity to take part in Donna Schmidt's care. Please do not hesitate to contact me with questions.  Sincerely,  Tonny Bollman, MD [facility]

## 2021-01-19 ENCOUNTER — Ambulatory Visit: Payer: BC Managed Care – PPO | Admitting: Internal Medicine

## 2021-01-19 ENCOUNTER — Other Ambulatory Visit: Payer: Self-pay

## 2021-01-19 ENCOUNTER — Encounter: Payer: Self-pay | Admitting: Internal Medicine

## 2021-01-19 VITALS — BP 122/84 | HR 85 | Temp 97.7°F | Resp 18 | Ht 69.0 in | Wt 239.8 lb

## 2021-01-19 DIAGNOSIS — J310 Chronic rhinitis: Secondary | ICD-10-CM | POA: Diagnosis not present

## 2021-01-19 DIAGNOSIS — T781XXA Other adverse food reactions, not elsewhere classified, initial encounter: Secondary | ICD-10-CM | POA: Insufficient documentation

## 2021-01-19 DIAGNOSIS — J3089 Other allergic rhinitis: Secondary | ICD-10-CM | POA: Diagnosis not present

## 2021-01-19 DIAGNOSIS — J329 Chronic sinusitis, unspecified: Secondary | ICD-10-CM | POA: Diagnosis not present

## 2021-01-19 DIAGNOSIS — K9049 Malabsorption due to intolerance, not elsewhere classified: Secondary | ICD-10-CM | POA: Diagnosis not present

## 2021-01-19 DIAGNOSIS — J302 Other seasonal allergic rhinitis: Secondary | ICD-10-CM | POA: Insufficient documentation

## 2021-01-19 MED ORDER — XHANCE 93 MCG/ACT NA EXHU: 2.0000 | INHALANT_SUSPENSION | Freq: Every day | NASAL | 6 refills | Status: AC

## 2021-01-19 NOTE — Patient Instructions (Addendum)
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 to citrus fruits/vegetables - testing today to milk, grapes, tomatoes, apples, strawberries, oranges, pineapple and black pepper were all negative (except borderline to orange but since you have eaten and tolerated oranges, suspect false positive) - your previous blood work was also negative including to oranges and milk (cut off for positive is > 0.35) + you are eating dairy regularly without symptoms, ruling this out - symptoms sound concerning for irritant contact dermatitis - avoidance is treatment - doubt systemic reaction based on history, if you did have a more severe reaction or concern for airway swelling, please use epinephrine autoinjector and call 911 --> should you have a reaction like this, please let us know - consider GI referral for discussion of other possible causes of abdominal bloating including IBD/IBS, etc (reassuring that your recent colonoscopy was normal)  Chronic rhinitis with history of ear effusions-perennial and seasonal allergic rhinitis - allergy testing positive to weeds, ragweed, trees, outdoor molds, dust mites, horse  - trial of Xhance (fluticasone); can use in place of flonase - 1-2 sprays daily or for block periods of time when symptomatic - can add daily antihistamine if needed, Your options include: Zyrtec (cetirizine) 10 mg, Claritin (loratadine) 10 mg, Xyzal (levocetirizine) 5 mg or Allegra (fexofenadine) 180 mg daily as needed -consider Astelin (azelastine) use 1-2 sprays in each nostril up to three times daily as needed for NASAL CONGESTION/ITCHY NOSE. (Now available over the counter) - avoidance measures as below, consider allergy shots for long term benefits  Follow-up in 4-6 months for allergic rhinitis. ---------------------------------------------------------------------------- Reducing Pollen Exposure  The American Academy of Allergy, Asthma and Immunology suggests the following steps to reduce your  exposure to pollen during allergy seasons.    Do not hang sheets or clothing out to dry; pollen may collect on these items. Do not mow lawns or spend time around freshly cut grass; mowing stirs up pollen. Keep windows closed at night.  Keep car windows closed while driving. Minimize morning activities outdoors, a time when pollen counts are usually at their highest. Stay indoors as much as possible when pollen counts or humidity is high and on windy days when pollen tends to remain in the air longer. Use air conditioning when possible.  Many air conditioners have filters that trap the pollen spores. Use a HEPA room air filter to remove pollen form the indoor air you breathe.  DUST MITE AVOIDANCE MEASURES:  There are three main measures that need and can be taken to avoid house dust mites:  Reduce accumulation of dust in general -reduce furniture, clothing, carpeting, books, stuffed animals, especially in bedroom  Separate yourself from the dust -use pillow and mattress encasements (can be found at stores such as [employer] or online) -avoid direct exposure to air condition flow -use a HEPA filter device, especially in the bedroom; you can also use a HEPA filter vacuum cleaner -wipe dust with a moist towel instead of a dry towel or broom when cleaning  Decrease mites and/or their secretions -wash clothing and linen and stuffed animals at highest temperature possible, at least every 2 weeks -stuffed animals can also be placed in a bag and put in a freezer overnight  Despite the above measures, it is impossible to eliminate dust mites or their allergen completely from your home.  With the above measures the burden of mites in your home can be diminished, with the goal of minimizing your allergic symptoms.  Success will be reached only when  implementing and using all means together.  Control of Mold Allergen   Mold and fungi can grow on a variety of surfaces provided certain  temperature and moisture conditions exist.  Outdoor molds grow on plants, decaying vegetation and soil.  The major outdoor mold, Alternaria and Cladosporium, are found in very high numbers during hot and dry conditions.  Generally, a late Summer - Fall peak is seen for common outdoor fungal spores.  Rain will temporarily lower outdoor mold spore count, but counts rise rapidly when the rainy period ends.  The most important indoor molds are Aspergillus and Penicillium.  Dark, humid and poorly ventilated basements are ideal sites for mold growth.  The next most common sites of mold growth are the bathroom and the kitchen.  Outdoor (Seasonal) Mold Control  Positive outdoor molds via skin testing: Alternaria and Cladosporium  Use air conditioning and keep windows closed Avoid exposure to decaying vegetation. Avoid leaf raking. Avoid grain handling. Consider wearing a face mask if working in moldy areas.

## 2021-01-20 ENCOUNTER — Ambulatory Visit
Admission: RE | Admit: 2021-01-20 | Discharge: 2021-01-20 | Disposition: A | Discharge status: Home / Self Care | Payer: BC Managed Care – PPO | Source: Ambulatory Visit | Attending: Obstetrics and Gynecology | Admitting: Obstetrics and Gynecology

## 2021-01-20 DIAGNOSIS — Z1231 Encounter for screening mammogram for malignant neoplasm of breast: Secondary | ICD-10-CM

## 2022-12-13 ENCOUNTER — Encounter: Payer: Self-pay | Admitting: Orthopaedic Surgery

## 2022-12-13 ENCOUNTER — Ambulatory Visit: Payer: BC Managed Care – PPO | Admitting: Orthopaedic Surgery

## 2022-12-13 ENCOUNTER — Other Ambulatory Visit (INDEPENDENT_AMBULATORY_CARE_PROVIDER_SITE_OTHER): Payer: Self-pay

## 2022-12-13 ENCOUNTER — Other Ambulatory Visit (INDEPENDENT_AMBULATORY_CARE_PROVIDER_SITE_OTHER): Payer: BC Managed Care – PPO

## 2022-12-13 DIAGNOSIS — M79641 Pain in right hand: Secondary | ICD-10-CM

## 2022-12-13 DIAGNOSIS — M25561 Pain in right knee: Secondary | ICD-10-CM

## 2022-12-13 DIAGNOSIS — M25562 Pain in left knee: Secondary | ICD-10-CM | POA: Diagnosis not present

## 2022-12-13 DIAGNOSIS — G8929 Other chronic pain: Secondary | ICD-10-CM

## 2022-12-13 NOTE — Progress Notes (Signed)
Office Visit Note   Patient: Donna Schmidt           Date of Birth: September 21, 1965           MRN: [id number] Visit Date: 12/13/2022              Requested by: Donna Inch, MD [address] [place],  [place] [address] PCP: Donna Inch, MD   Assessment & Plan: Visit Diagnoses:  1. Pain in right hand   2. 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  Treatment options were discussed.  Disease process reviewed.  She would like to start with home exercises and over-the-counter NSAIDs and Voltaren gel.  Questions encouraged and answered.  Follow-up as needed.  Follow-Up Instructions: No follow-ups on file.   Orders:  Orders Placed This Encounter  Procedures   XR KNEE 3 VIEW LEFT   XR KNEE 3 VIEW RIGHT   XR Hand Complete Right   No orders of the defined types were placed in this encounter.     Procedures: No procedures performed   Clinical Data: No additional findings.   Subjective: Chief Complaint  Patient presents with   Left Knee - Pain   Right Knee - Pain    HPI Donna Schmidt is a 57 year old female here for evaluation of bilateral knee pain greater on the right for 3 months and right thumb pain at the base for a year.  Denies any injuries. Review of Systems  Constitutional: Negative.   HENT: Negative.    Eyes: Negative.   Respiratory: Negative.    Cardiovascular: Negative.   Endocrine: Negative.   Musculoskeletal: Negative.   Neurological: Negative.   Hematological: Negative.   Psychiatric/Behavioral: Negative.    All other systems reviewed and are negative.    Objective: Vital Signs: LMP 08/19/2012   Physical Exam Vitals and nursing note reviewed.  Constitutional:      Appearance: She is well-developed.  HENT:     Head: Atraumatic.     Nose: Nose normal.  Eyes:     Extraocular Movements: Extraocular movements intact.   Cardiovascular:     Pulses: Normal pulses.  Pulmonary:     Effort: Pulmonary effort is normal.  Abdominal:     Palpations: Abdomen is soft.  Musculoskeletal:     Cervical back: Neck supple.  Skin:    General: Skin is warm.     Capillary Refill: Capillary refill takes less than 2 seconds.  Neurological:     Mental Status: She is alert. Mental status is at baseline.  Psychiatric:        Behavior: Behavior normal.        Thought Content: Thought content normal.        Judgment: Judgment normal.     Ortho Exam Examination of the right thumb shows pain with CMC grind test.  There is no triggering.  No palpable nodules.  Examination bilateral knees show medial joint line tenderness worse on the right.  No joint effusion.  Patellofemoral crepitus with range of motion. Specialty Comments:  No specialty comments available.  Imaging: No results found.   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 No pertinent surgical history. Social History   Occupational History   Not on file  Tobacco Use   Smoking status: Never   Smokeless tobacco: Never  Substance and Sexual Activity   Alcohol use: Yes    Comment: occ   Drug use: No   Sexual activity: Not on file

## 2022-12-28 ENCOUNTER — Encounter: Payer: Self-pay | Admitting: Orthopaedic Surgery

## 2023-09-11 NOTE — Progress Notes (Signed)
 Cardiology Office Note:    Date:  09/14/2023   ID:  Donna Schmidt, DOB [birth year], MRN [id number]  PCP:  Samie Frederick, PA-C   [facility] Providers Cardiologist:  None     Referring MD: Leva Rush, MD   Chief Complaint  Patient presents with   Palpitations    History of Present Illness:    Donna Schmidt is a 58 y.o. female is seen at the request of Dr Leva for evaluation of tachycardia. She has a history of HTN. She states she has been on antihypertensive therapy for over 20 yrs. She was evaluated in 2021 at [facility] with normal 30 day event monitor and normal Echo. She reports one episode at work in Feb while standing at her desk she didn't feel well and her HR monitor showed a HR of 135. She sat down and felt better in a few minutes. No recurrence. She does have some swelling in her legs. Does have chronic venous insufficiency and reports she had vein work done on both legs a couple of years ago. Otherwise denies chest pain or dyspnea.  Past Medical History:  Diagnosis Date   Angio-edema    Breast cyst, left    Fibromyalgia    Hypertensive disorder    Post-menopausal    Tachycardia    UTI (urinary tract infection)     History reviewed. No pertinent surgical history.  Current Medications: Current Meds  Medication Sig   amoxicillin (AMOXIL) 875 MG tablet Take 1 tablet by mouth 2 (two) times daily.   EPINEPHrine 0.3 mg/0.3 mL IJ SOAJ injection Inject into the muscle.   fluticasone (FLONASE) 50 MCG/ACT nasal spray Flonase   Fluticasone Propionate  (XHANCE ) 93 MCG/ACT EXHU Place 2 sprays into the nose daily.   lisinopril-hydrochlorothiazide (PRINZIDE,ZESTORETIC) 20-25 MG per tablet Take 1 tablet by mouth daily.     Allergies:   Erythromycin and Erythromycin base   Social History   Socioeconomic History   Marital status: Married    Spouse name: Not on file   Number of children: 2   Years of education: Not on file   Highest education  level: Not on file  Occupational History   Not on file  Tobacco Use   Smoking status: Never   Smokeless tobacco: Never  Substance and Sexual Activity   Alcohol use: Yes    Comment: occ   Drug use: No   Sexual activity: Not on file  Other Topics Concern   Not on file  Social History Narrative   [employer]    Social Drivers of Health   Financial Resource Strain: Patient Declined (07/28/2023)   Received from [facility]   Overall Financial Resource Strain (CARDIA)    Difficulty of Paying Living Expenses: Patient declined  Food Insecurity: No Food Insecurity (07/28/2023)   Received from [facility]   Hunger Vital Sign    Within the past 12 months, you worried that your food would run out before you got the money to buy more.: Never true    Within the past 12 months, the food you bought just didn't last and you didn't have money to get more.: Never true  Transportation Needs: No Transportation Needs (07/28/2023)   Received from [facility] - Transportation    Lack of Transportation (Medical): No    Lack of Transportation (Non-Medical): No  Physical Activity: Insufficiently Active (07/28/2023)   Received from [facility]   Exercise Vital Sign    On average,  how many days per week do you engage in moderate to strenuous exercise (like a brisk walk)?: 2 days    On average, how many minutes do you engage in exercise at this level?: 30 min  Stress: No Stress Concern Present (07/28/2023)   Received from [facility] - Occupational Stress Questionnaire    Feeling of Stress : Not at all  Social Connections: Socially Integrated (07/28/2023)   Received from [facility]   Social Network    How would you rate your social network (family, work, friends)?: Good participation with social networks     Family History: The patient's family history includes Arthritis in her maternal grandfather; Asthma in her father; Heart  attack in her father; Hypertension in her father; Multiple sclerosis in her mother; Parkinson's disease in her mother; Thyroid disease in her mother; Varicose Veins in her father.  ROS:   Please see the history of present illness.     All other systems reviewed and are negative.  EKGs/Labs/Other Studies Reviewed:    The following studies were reviewed today: EKG Interpretation Date/Time:  Friday September 14 2023 08:44:05 EDT Ventricular Rate:  76 PR Interval:  184 QRS Duration:  80 QT Interval:  370 QTC Calculation: 416 R Axis:   -11  Text Interpretation: Normal sinus rhythm Low voltage QRS Cannot rule out Anterior infarct , age undetermined No previous ECGs available Confirmed by [place], Kason Benak [phone]) on 09/14/2023 8:45:11 AM   EKG Interpretation Date/Time:  Friday September 14 2023 08:44:05 EDT Ventricular Rate:  76 PR Interval:  184 QRS Duration:  80 QT Interval:  370 QTC Calculation: 416 R Axis:   -11  Text Interpretation: Normal sinus rhythm Low voltage QRS Cannot rule out Anterior infarct , age undetermined No previous ECGs available Confirmed by [place], Tim Corriher [phone]) on 09/14/2023 8:45:11 AM   Echo 08/14/19:  Left Ventricle: Systolic function is normal. EF: 65%. Wall motion is  normal. Doppler parameters indicate normal diastolic function.    Tricuspid Valve: The right ventricular systolic pressure is normal (<36  mmHg).   No significant valvular disease noted.   30 day Event monitor 08/26/19: Impression  Cardiac event recorder reveals predominantly normal sinus rhythm with an average heart rate of 79.  No cardiac ectopy or cardiac arrhythmias are noted.  Symptoms of palpitations are unassociated with cardiac arrhythmias.  Conclusion: Essentially normal study  Recent Labs: No results found for requested labs within last 365 days.  Recent Lipid Panel No results found for: CHOL, TRIG, HDL, CHOLHDL, VLDL, LDLCALC, LDLDIRECT   Risk Assessment/Calculations:                 Physical Exam:    VS:  BP 126/83 (BP Location: Right Arm, Patient Position: Sitting, Cuff Size: Large)   Pulse 82   Ht 5' 9 (1.753 m)   Wt 246 lb (111.6 kg)   LMP 08/19/2012   SpO2 96%   BMI 36.33 kg/m     Wt Readings from Last 3 Encounters:  09/14/23 246 lb (111.6 kg)  01/19/21 239 lb 12.8 oz (108.8 kg)  01/15/13 205 lb 12.8 oz (93.4 kg)     GEN:  Well nourished, well developed in no acute distress HEENT: Normal NECK: No JVD; No carotid bruits LYMPHATICS: No lymphadenopathy CARDIAC: RRR, no murmurs, rubs, gallops RESPIRATORY:  Clear to auscultation without rales, wheezing or rhonchi  ABDOMEN: Soft, non-tender, non-distended MUSCULOSKELETAL:  No edema; No deformity  SKIN: Warm and dry  NEUROLOGIC:  Alert and oriented x 3 PSYCHIATRIC:  Normal affect   ASSESSMENT:    1. Primary hypertension   2. Varicose veins of bilateral lower extremities with other complications    PLAN:    In order of problems listed above:  HTN. BP is well controlled on current therapy and I would continue same.  LE edema related to venous insufficiency. Recommend sodium restriction and support hose. Prior Echo in 2021 was normal.  Tachycardia - single episode when stressed. No recurrence. Normal Event monitor in 2021     No indication for additional cardiac evaluation at this time. Will follow up PRN      Medication Adjustments/Labs and Tests Ordered: Current medicines are reviewed at length with the patient today.  Concerns regarding medicines are outlined above.  Orders Placed This Encounter  Procedures   EKG 12-Lead   No orders of the defined types were placed in this encounter.   Patient Instructions  Medication Instructions:  Continue same medications  Lab Work: None ordered  Testing/Procedures: None ordered  Follow-Up: At [facility], you and your health needs are our priority.  As part of our continuing mission to provide you with exceptional heart  care, our providers are all part of one team.  This team includes your primary Cardiologist (physician) and Advanced Practice Providers or APPs (Physician Assistants and Nurse Practitioners) who all work together to provide you with the care you need, when you need it.  Your next appointment:  As Needed    Provider:  Dr.Rylynn Schoneman   We recommend signing up for the patient portal called MyChart.  Sign up information is provided on this After Visit Summary.  MyChart is used to connect with patients for Virtual Visits (Telemedicine).  Patients are able to view lab/test results, encounter notes, upcoming appointments, etc.  Non-urgent messages can be sent to your provider as well.   To learn more about what you can do with MyChart, go to [email or web address].         Signed, Burt Piatek [place], MD  09/14/2023 9:02 AM    [facility]

## 2023-09-14 ENCOUNTER — Encounter: Payer: Self-pay | Admitting: Cardiology

## 2023-09-14 ENCOUNTER — Ambulatory Visit: Attending: Cardiology | Admitting: Cardiology

## 2023-09-14 VITALS — BP 126/83 | HR 82 | Ht 69.0 in | Wt 246.0 lb

## 2023-09-14 DIAGNOSIS — I10 Essential (primary) hypertension: Secondary | ICD-10-CM | POA: Diagnosis not present

## 2023-09-14 DIAGNOSIS — I83893 Varicose veins of bilateral lower extremities with other complications: Secondary | ICD-10-CM

## 2023-09-14 NOTE — Patient Instructions (Signed)
